# Patient Record
Sex: Male | Born: 1975 | Race: Black or African American | Hispanic: No | Marital: Single | Smoking: Current every day smoker
Health system: Southern US, Community
[De-identification: ages and names within clinical notes are randomized; demographics above are authoritative.]

## PROBLEM LIST (undated history)

## (undated) HISTORY — PX: BACK SURGERY: SHX140

---

## 2007-09-05 ENCOUNTER — Emergency Department: Payer: Self-pay | Admitting: Emergency Medicine

## 2007-11-20 ENCOUNTER — Emergency Department: Payer: Self-pay | Admitting: Emergency Medicine

## 2007-12-07 ENCOUNTER — Emergency Department: Payer: Self-pay | Admitting: Emergency Medicine

## 2008-12-11 ENCOUNTER — Emergency Department: Payer: Self-pay | Admitting: Emergency Medicine

## 2009-04-26 ENCOUNTER — Emergency Department: Payer: Self-pay | Admitting: Emergency Medicine

## 2009-06-04 ENCOUNTER — Emergency Department: Payer: Self-pay | Admitting: Emergency Medicine

## 2020-06-11 ENCOUNTER — Emergency Department: Payer: Self-pay

## 2020-06-11 ENCOUNTER — Other Ambulatory Visit: Payer: Self-pay

## 2020-06-11 ENCOUNTER — Emergency Department
Admission: EM | Admit: 2020-06-11 | Discharge: 2020-06-11 | Disposition: A | Discharge status: Home / Self Care | Payer: Self-pay | Attending: Emergency Medicine | Admitting: Emergency Medicine

## 2020-06-11 DIAGNOSIS — R0781 Pleurodynia: Secondary | ICD-10-CM

## 2020-06-11 DIAGNOSIS — F17200 Nicotine dependence, unspecified, uncomplicated: Secondary | ICD-10-CM | POA: Insufficient documentation

## 2020-06-11 LAB — CBC WITH DIFFERENTIAL/PLATELET
Abs Immature Granulocytes: 0.02 10*3/uL (ref 0.00–0.07)
Basophils Absolute: 0 10*3/uL (ref 0.0–0.1)
Basophils Relative: 0 %
Eosinophils Absolute: 0.1 10*3/uL (ref 0.0–0.5)
Eosinophils Relative: 1 %
HCT: 51.4 % (ref 39.0–52.0)
Hemoglobin: 17.4 g/dL — ABNORMAL HIGH (ref 13.0–17.0)
Immature Granulocytes: 0 %
Lymphocytes Relative: 28 %
Lymphs Abs: 2.2 10*3/uL (ref 0.7–4.0)
MCH: 33.4 pg (ref 26.0–34.0)
MCHC: 33.9 g/dL (ref 30.0–36.0)
MCV: 98.7 fL (ref 80.0–100.0)
Monocytes Absolute: 0.5 10*3/uL (ref 0.1–1.0)
Monocytes Relative: 7 %
Neutro Abs: 4.9 10*3/uL (ref 1.7–7.7)
Neutrophils Relative %: 64 %
Platelets: 295 10*3/uL (ref 150–400)
RBC: 5.21 MIL/uL (ref 4.22–5.81)
RDW: 12.3 % (ref 11.5–15.5)
WBC: 7.7 10*3/uL (ref 4.0–10.5)
nRBC: 0 % (ref 0.0–0.2)

## 2020-06-11 LAB — BASIC METABOLIC PANEL
Anion gap: 11 (ref 5–15)
BUN: 7 mg/dL (ref 6–20)
CO2: 25 mmol/L (ref 22–32)
Calcium: 10.2 mg/dL (ref 8.9–10.3)
Chloride: 105 mmol/L (ref 98–111)
Creatinine, Ser: 0.92 mg/dL (ref 0.61–1.24)
GFR, Estimated: >60 mL/min (ref >60)
Glucose, Bld: 115 mg/dL — ABNORMAL HIGH (ref 70–99)
Potassium: 6.1 mmol/L — ABNORMAL HIGH (ref 3.5–5.1)
Sodium: 141 mmol/L (ref 135–145)

## 2020-06-11 LAB — FIBRIN DERIVATIVES D-DIMER (ARMC ONLY): Fibrin derivatives D-dimer (ARMC): 127 ng/mL (FEU) (ref 0.00–499.00)

## 2020-06-11 LAB — TROPONIN I (HIGH SENSITIVITY): Troponin I (High Sensitivity): 4 ng/L (ref <18)

## 2020-06-11 MED ORDER — HYDROCODONE-ACETAMINOPHEN 5-325 MG PO TABS: 1.0000 | ORAL_TABLET | ORAL | 0 refills | Status: DC | PRN

## 2020-06-11 MED ORDER — KETOROLAC TROMETHAMINE 30 MG/ML IJ SOLN
15.0000 mg | Freq: Once | INTRAMUSCULAR | Status: AC
  Administered 2020-06-11: 15 mg via INTRAVENOUS
  Filled 2020-06-11: qty 1

## 2020-06-11 MED ORDER — PREDNISONE 10 MG (21) PO TBPK: ORAL_TABLET | ORAL | 0 refills | Status: DC

## 2020-06-11 NOTE — ED Triage Notes (Signed)
[place] c/o left sided/lateral chest pain with cough and deep breathing since yesterday, states he has been having a cough in the past few days,.

## 2020-06-11 NOTE — ED Notes (Signed)
Pt alert and oriented X 4, stable for discharge. RR even and unlabored, color WNL. Discussed discharge instructions and follow-up as directed. Discharge medications discussed if provided. Pt had opportunity to ask questions if necessary and RN to provide patient/family eduction.  

## 2020-06-11 NOTE — ED Provider Notes (Signed)
[facility] Emergency Department Provider Note ____________________________________________   First MD Initiated Contact with Patient 06/11/20 1132     (approximate)  I have reviewed the triage vital signs and the nursing notes.   HISTORY  Chief Complaint Pleurisy  HPI Lucas Santos is a 44 y.o. male with history of back surgery presents to the emergency department for treatment and evaluation of left sided chest wall pain that started yesterday. Pain is worse with deep breath, cough, or movement. No specific injury. He has had a cough for the past few days, but otherwise at his normal state of health.          History reviewed. No pertinent past medical history.  There are no problems to display for this patient.   Past Surgical History:  Procedure Laterality Date  . BACK SURGERY      Prior to Admission medications   Medication Sig Start Date End Date Taking? Authorizing Provider  HYDROcodone-acetaminophen (NORCO/VICODIN) 5-325 MG tablet Take 1 tablet by mouth every 4 (four) hours as needed for moderate pain. 06/11/20 06/11/21  Fred Franzen, Kasandra Knudsen, FNP  predniSONE (STERAPRED UNI-PAK 21 TAB) 10 MG (21) TBPK tablet Take 6 tablets on the first day and decrease by 1 tablet each day until finished. 06/11/20   Chinita Pester, FNP    Allergies Patient has no known allergies.  No family history on file.  Social History Social History   Tobacco Use  . Smoking status: Current Every Day Smoker  . Smokeless tobacco: Never Used  Substance Use Topics  . Alcohol use: Yes  . Drug use: Not Currently    Review of Systems  Constitutional: No fever/chills Eyes: No visual changes. ENT: No sore throat. Cardiovascular: Denies chest pain. Respiratory: Denies shortness of breath. Gastrointestinal: No abdominal pain.  No nausea, no vomiting.  No diarrhea.  No constipation. Genitourinary: Negative for dysuria. Musculoskeletal: Positive for back  pain. Skin: Negative for rash. Neurological: Negative for headaches, focal weakness or numbness  ____________________________________________   PHYSICAL EXAM:  VITAL SIGNS: ED Triage Vitals  Enc Vitals Group     BP 06/11/20 0942 114/80     Pulse Rate 06/11/20 0942 90     Resp 06/11/20 1128 20     Temp 06/11/20 0942 98.6 F (37 C)     Temp Source 06/11/20 0942 Oral     SpO2 06/11/20 0942 99 %     Weight 06/11/20 0938 170 lb (77.1 kg)     Height 06/11/20 0938 5\' 11"  (1.803 m)     Head Circumference --      Peak Flow --      Pain Score 06/11/20 0938 9     Pain Loc --      Pain Edu? --      Excl. in GC? --     Constitutional: Alert and oriented. Well appearing and in no acute distress. Eyes: Conjunctivae are normal. PERRL. EOMI. Head: Atraumatic. Nose: No congestion/rhinnorhea. Mouth/Throat: Mucous membranes are moist.  Oropharynx non-erythematous. Neck: No stridor.   Hematological/Lymphatic/Immunilogical: No cervical lymphadenopathy. Cardiovascular: Normal rate, regular rhythm. Grossly normal heart sounds.  Good peripheral circulation. Respiratory: Normal respiratory effort.  No retractions. Lungs CTAB. Gastrointestinal: Soft and nontender. No distention. No abdominal bruits. No CVA tenderness. Genitourinary:  Musculoskeletal: No lower extremity tenderness nor edema.  No joint effusions. Neurologic:  Normal speech and language. No gross focal neurologic deficits are appreciated. No gait instability. Skin:  Skin is warm, dry and intact. No  rash noted. Psychiatric: Mood and affect are normal. Speech and behavior are normal.  ____________________________________________   LABS (all labs ordered are listed, but only abnormal results are displayed)  Labs Reviewed  BASIC METABOLIC PANEL - Abnormal; Notable for the following components:      Result Value   Potassium 6.1 (*)    Glucose, Bld 115 (*)    All other components within normal limits  CBC WITH  DIFFERENTIAL/PLATELET - Abnormal; Notable for the following components:   Hemoglobin 17.4 (*)    All other components within normal limits  FIBRIN DERIVATIVES D-DIMER ([facility] ONLY)  TROPONIN I (HIGH SENSITIVITY)   ____________________________________________  EKG  Normal Sinus Rhythm, rate 88, no ectopy ____________________________________________  RADIOLOGY  ED MD interpretation:    No acute cardiopulmonary abnormality.  I, Kem Boroughs, personally viewed and evaluated these images (plain radiographs) as part of my medical decision making, as well as reviewing the written report by the radiologist.  Official radiology report(s): No results found.  ____________________________________________   PROCEDURES  Procedure(s) performed (including Critical Care):  Procedures  ____________________________________________   INITIAL IMPRESSION / ASSESSMENT AND PLAN     44 year old male presenting to the emergency department for treatment and evaluation of pain lateral chest wall that increases with deep breath or movement.  See HPI for further details.  Labs and EKG obtained while awaiting ER room assignment reviewed.  Plan will be to check D-dimer and medicate for pain.  DIFFERENTIAL DIAGNOSIS  Musculoskeletal pain, PE, pleurisy  ED COURSE  D-dimer is within normal limits, EKG shows sinus rhythm, chest x-ray shows no cardiopulmonary abnormality and pain has lessened with medication.  Plan will be to have him discharged home to follow-up with primary care.  He will be given prescription for prednisone and Norco.  He is to return to the emergency department for symptoms of change or worsen if he is unable to schedule appointment with primary care.    ___________________________________________   FINAL CLINICAL IMPRESSION(S) / ED DIAGNOSES  Final diagnoses:  Pleuritic chest pain     ED Discharge Orders         Ordered    predniSONE (STERAPRED UNI-PAK 21 TAB) 10 MG (21)  TBPK tablet  Status:  Discontinued        06/11/20 1332    HYDROcodone-acetaminophen (NORCO/VICODIN) 5-325 MG tablet  Every 4 hours PRN,   Status:  Discontinued        06/11/20 1332    HYDROcodone-acetaminophen (NORCO/VICODIN) 5-325 MG tablet  Every 4 hours PRN,   Status:  Discontinued        06/11/20 1333    predniSONE (STERAPRED UNI-PAK 21 TAB) 10 MG (21) TBPK tablet  Status:  Discontinued        06/11/20 1333    predniSONE (STERAPRED UNI-PAK 21 TAB) 10 MG (21) TBPK tablet        06/11/20 1334    HYDROcodone-acetaminophen (NORCO/VICODIN) 5-325 MG tablet  Every 4 hours PRN        06/11/20 1334           Hesston L Arvidson was evaluated in Emergency Department on 06/15/2020 for the symptoms described in the history of present illness. He was evaluated in the context of the global COVID-19 pandemic, which necessitated consideration that the patient might be at risk for infection with the SARS-CoV-2 virus that causes COVID-19. Institutional protocols and algorithms that pertain to the evaluation of patients at risk for COVID-19 are in a state of rapid change based  on information released by regulatory bodies including the CDC and federal and state organizations. These policies and algorithms were followed during the patient's care in the ED.   Note:  This document was prepared using Dragon voice recognition software and may include unintentional dictation errors.   Chinita Pester, FNP 06/15/20 1617    Shaune Pollack, MD 06/18/20 1501

## 2020-06-11 NOTE — Discharge Instructions (Signed)
Follow up with the primary care provider of your choice for symptoms that are not improving over the next few days.  Return to the ER for symptoms that change or worsen if unable to schedule an appointment. 

## 2020-10-24 ENCOUNTER — Other Ambulatory Visit: Payer: Self-pay

## 2020-10-24 ENCOUNTER — Encounter: Payer: Self-pay | Admitting: Emergency Medicine

## 2020-10-24 ENCOUNTER — Emergency Department: Payer: Self-pay

## 2020-10-24 ENCOUNTER — Emergency Department
Admission: EM | Admit: 2020-10-24 | Discharge: 2020-10-25 | Disposition: A | Discharge status: Home / Self Care | Payer: Self-pay | Attending: Emergency Medicine | Admitting: Emergency Medicine

## 2020-10-24 DIAGNOSIS — F17200 Nicotine dependence, unspecified, uncomplicated: Secondary | ICD-10-CM | POA: Insufficient documentation

## 2020-10-24 DIAGNOSIS — Y9369 Activity, other involving other sports and athletics played as a team or group: Secondary | ICD-10-CM | POA: Insufficient documentation

## 2020-10-24 DIAGNOSIS — S81801A Unspecified open wound, right lower leg, initial encounter: Secondary | ICD-10-CM | POA: Insufficient documentation

## 2020-10-24 DIAGNOSIS — W34010A Accidental discharge of airgun, initial encounter: Secondary | ICD-10-CM | POA: Insufficient documentation

## 2020-10-24 MED ORDER — CEPHALEXIN 500 MG PO CAPS
500.0000 mg | ORAL_CAPSULE | Freq: Once | ORAL | Status: AC
  Administered 2020-10-25: 500 mg via ORAL
  Filled 2020-10-24: qty 1

## 2020-10-24 NOTE — ED Notes (Signed)
Report to [facility]
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
## 2020-10-25 MED ORDER — CEPHALEXIN 500 MG PO CAPS: 500.0000 mg | ORAL_CAPSULE | Freq: Four times a day (QID) | ORAL | 0 refills | Status: AC
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
## 2020-12-27 IMAGING — CR DG CHEST 2V
1 series · 2 of 2 positions shown · non-contrast
Comparison: None.

CLINICAL DATA: Chest pain and cough for 2 days, initial encounter

EXAM:
CHEST - 2 VIEW

[Series 1: dg chest 2 view · 0.14mm/px · 2 of 2 slices shown]
[im 1/2]
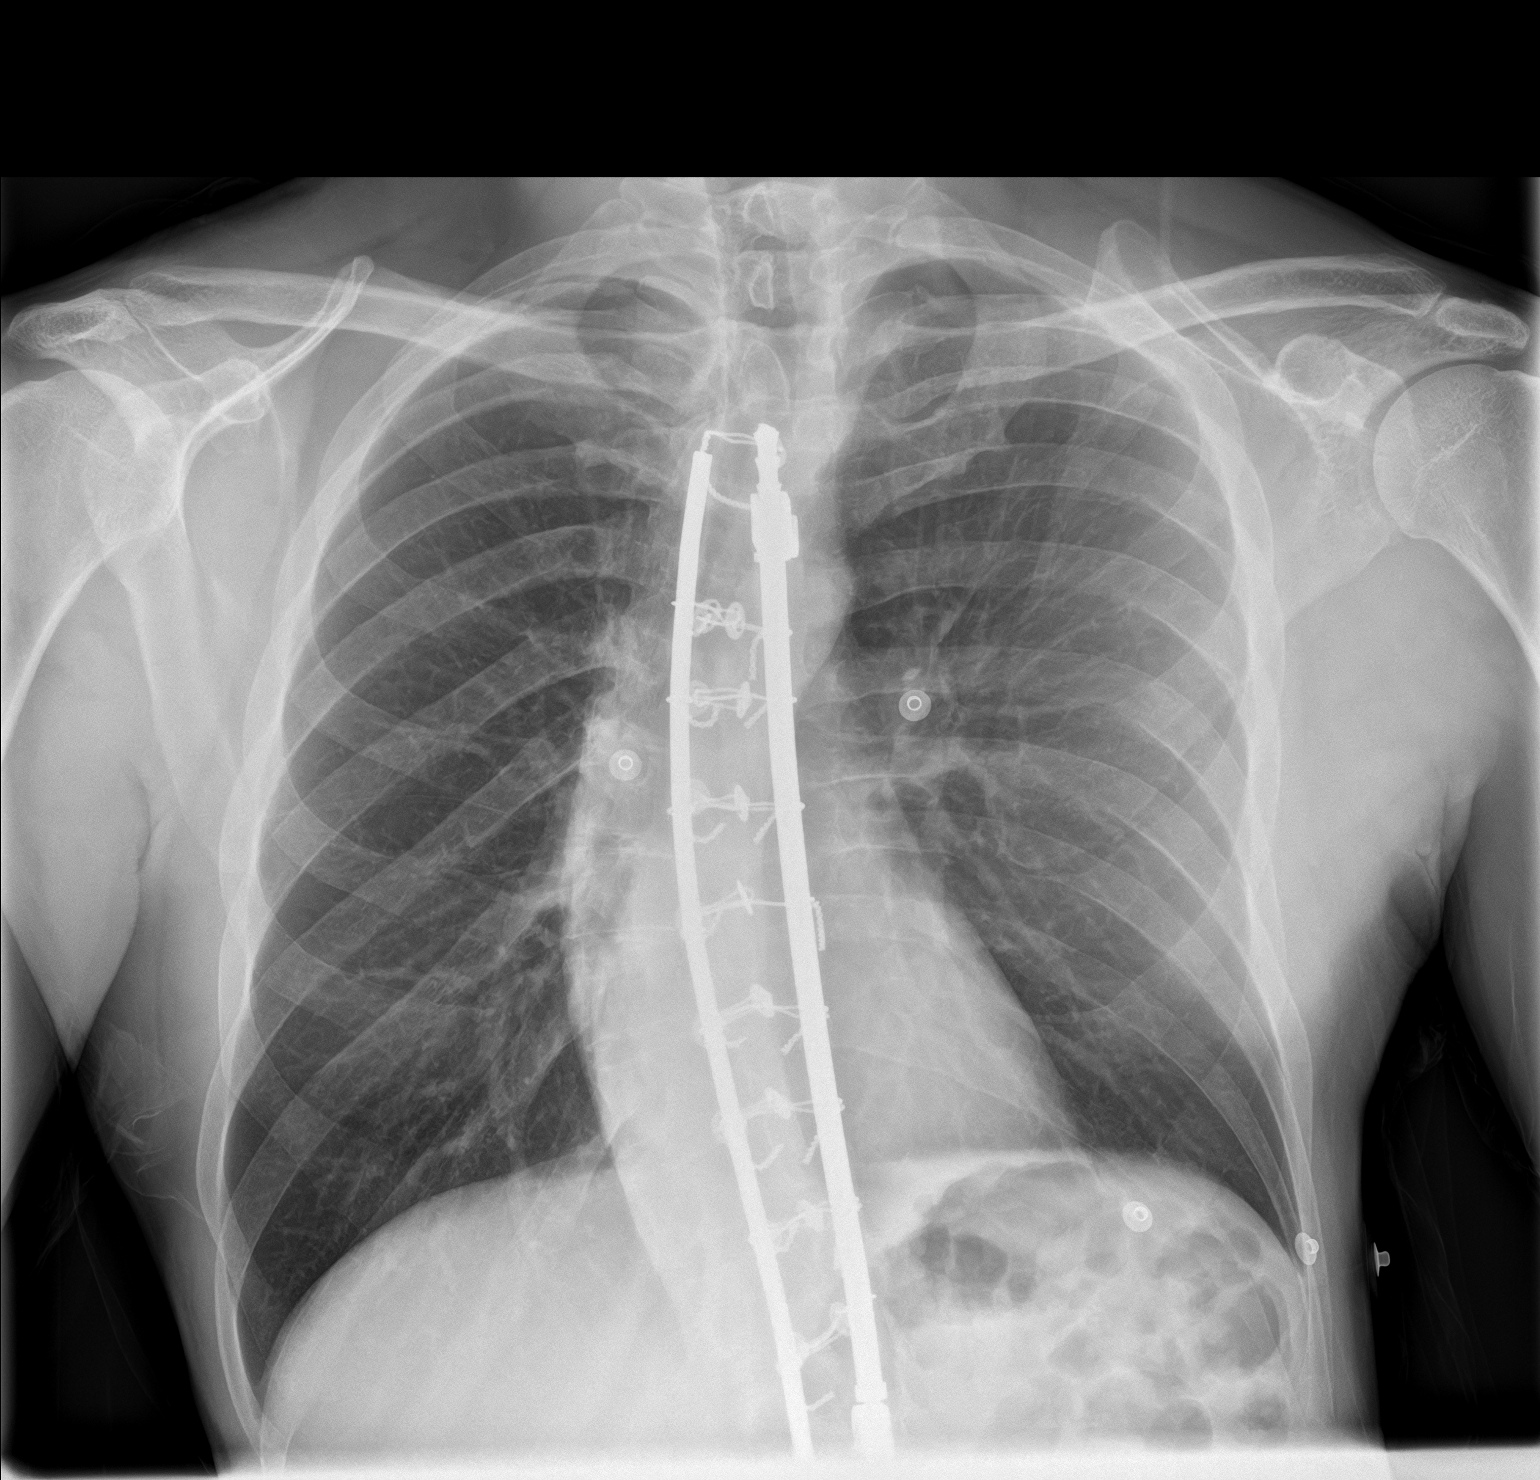
[im 2/2]
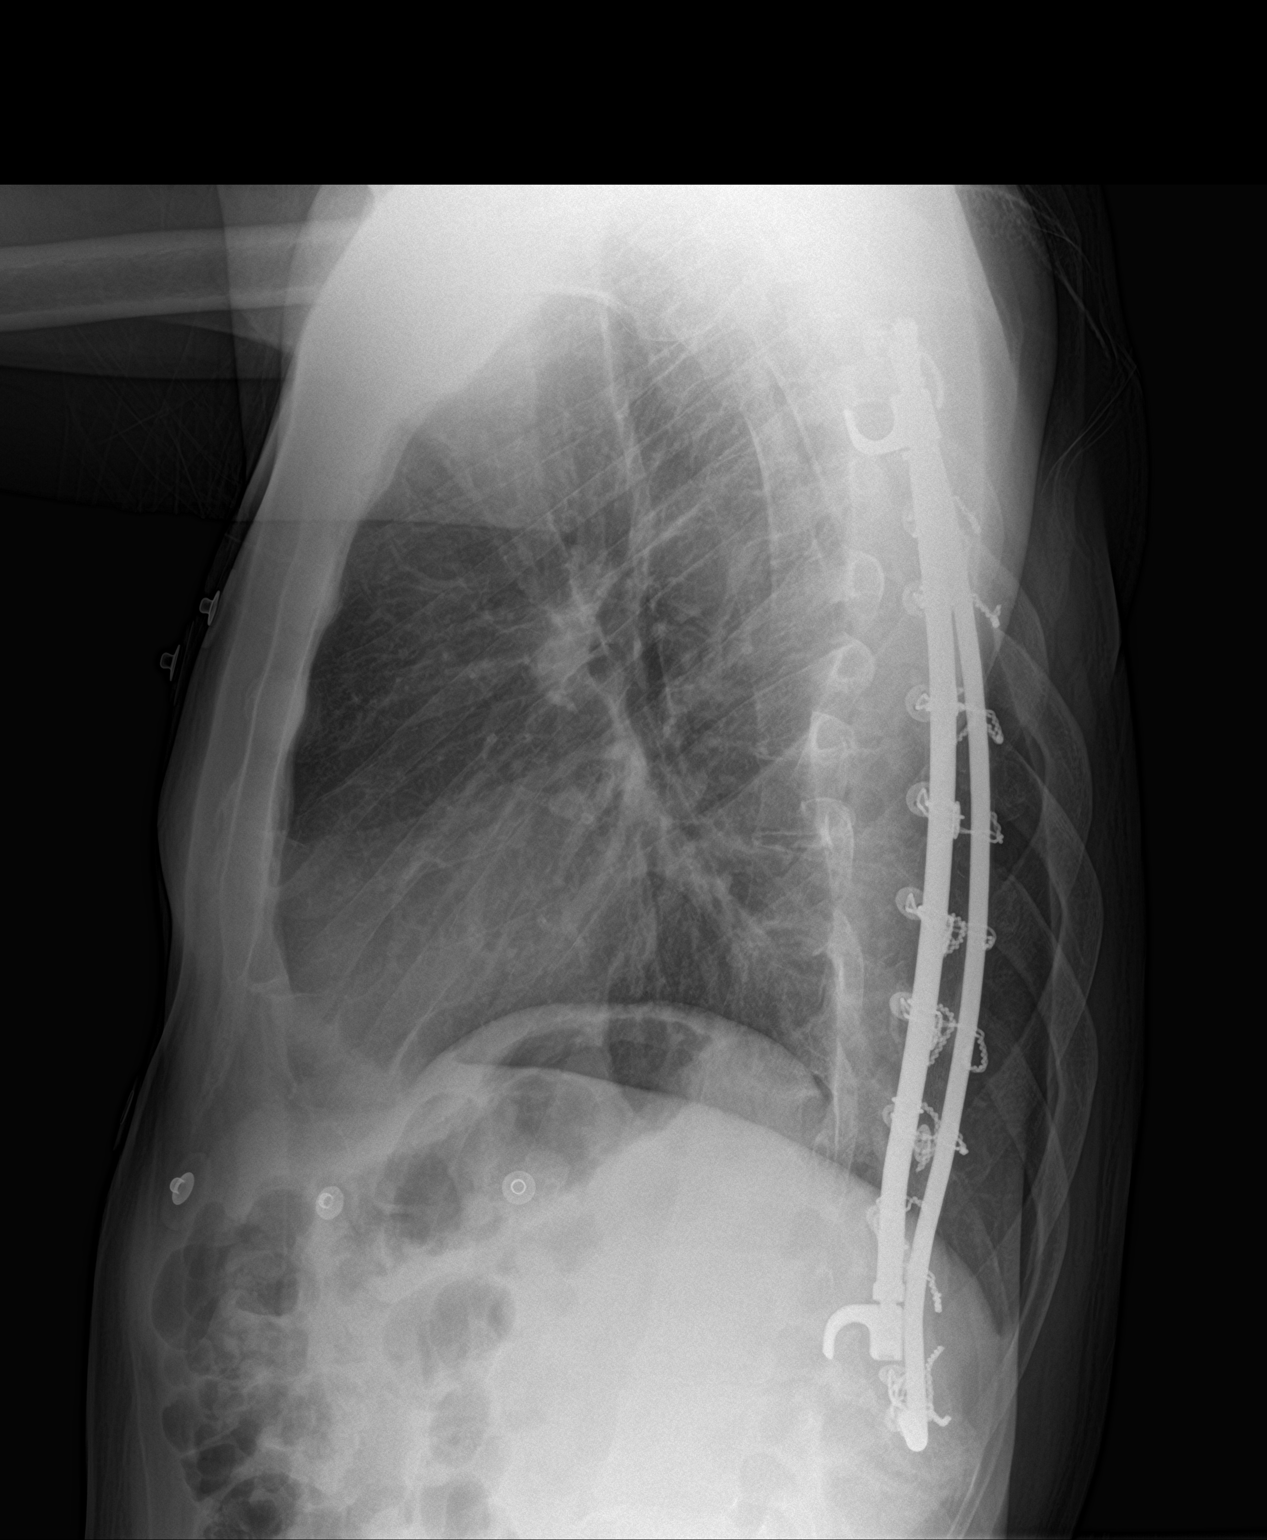

[2 of 2 positions shown; findings below may reference images not displayed]

FINDINGS: Cardiac shadow is within normal limits. The lungs are clear
bilaterally. Scoliosis of the thoracic spine concave to the left is
noted. Fixation rods are seen.
IMPRESSION: No acute abnormality noted.

## 2021-04-12 ENCOUNTER — Emergency Department (HOSPITAL_COMMUNITY): Payer: Self-pay

## 2021-04-12 ENCOUNTER — Other Ambulatory Visit: Payer: Self-pay

## 2021-04-12 ENCOUNTER — Emergency Department (HOSPITAL_COMMUNITY)
Admission: EM | Admit: 2021-04-12 | Discharge: 2021-04-12 | Disposition: A | Discharge status: Home / Self Care | Payer: Self-pay | Attending: Emergency Medicine | Admitting: Emergency Medicine

## 2021-04-12 DIAGNOSIS — F17200 Nicotine dependence, unspecified, uncomplicated: Secondary | ICD-10-CM | POA: Insufficient documentation

## 2021-04-12 DIAGNOSIS — R0789 Other chest pain: Secondary | ICD-10-CM | POA: Insufficient documentation

## 2021-04-12 DIAGNOSIS — Z20822 Contact with and (suspected) exposure to covid-19: Secondary | ICD-10-CM | POA: Insufficient documentation

## 2021-04-12 LAB — CBC
HCT: 45.2 % (ref 39.0–52.0)
Hemoglobin: 15.4 g/dL (ref 13.0–17.0)
MCH: 33.6 pg (ref 26.0–34.0)
MCHC: 34.1 g/dL (ref 30.0–36.0)
MCV: 98.5 fL (ref 80.0–100.0)
Platelets: 301 10*3/uL (ref 150–400)
RBC: 4.59 MIL/uL (ref 4.22–5.81)
RDW: 11 % — ABNORMAL LOW (ref 11.5–15.5)
WBC: 6.7 10*3/uL (ref 4.0–10.5)
nRBC: 0 % (ref 0.0–0.2)

## 2021-04-12 LAB — BASIC METABOLIC PANEL
Anion gap: 6 (ref 5–15)
BUN: 5 mg/dL — ABNORMAL LOW (ref 6–20)
CO2: 28 mmol/L (ref 22–32)
Calcium: 9.6 mg/dL (ref 8.9–10.3)
Chloride: 103 mmol/L (ref 98–111)
Creatinine, Ser: 0.81 mg/dL (ref 0.61–1.24)
GFR, Estimated: >60 mL/min (ref >60)
Glucose, Bld: 107 mg/dL — ABNORMAL HIGH (ref 70–99)
Potassium: 4.8 mmol/L (ref 3.5–5.1)
Sodium: 137 mmol/L (ref 135–145)

## 2021-04-12 LAB — TROPONIN I (HIGH SENSITIVITY)
Troponin I (High Sensitivity): 4 ng/L (ref <18)
Troponin I (High Sensitivity): 4 ng/L (ref <18)

## 2021-04-12 LAB — RESP PANEL BY RT-PCR (FLU A&B, COVID) ARPGX2
Influenza A by PCR: NEGATIVE
Influenza B by PCR: NEGATIVE
SARS Coronavirus 2 by RT PCR: NEGATIVE

## 2021-04-12 MED ORDER — LIDOCAINE 5 % EX PTCH
1.0000 | MEDICATED_PATCH | CUTANEOUS | Status: DC
  Administered 2021-04-12: 1 via TRANSDERMAL
  Filled 2021-04-12 (×2): qty 1
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
## 2021-04-12 NOTE — ED Provider Notes (Signed)
[facility] EMERGENCY DEPARTMENT Provider Note   CSN: [id number] Arrival date & time: 04/12/21  1133     History Chief Complaint  Patient presents with   Chest Pain    Lucas Santos is a 45 y.o. male who is incarcerated who presents to the emergency department by EMS with concern for 2 episodes of sharp right-sided chest pain.  The first of which happened last night, then again this morning shortly after he woke up.  Each time the pain lasted for less than 5 minutes and then resolved on its own.  Nothing made it worse or better.  [facility] who gave him 3 tablets of nitroglycerin and aspirin, though he states that each of these times he was not experiencing any chest pain.  He also placed him on oxygen.  He does not have any history of cardiac issues.  He has been chest pain-free at this time.  Pain did not radiate, was not exertional in nature, and had no associated shortness of breath or nausea.  Denies any palpitations.   I personally reviewed this patient's medical records.  He was seen last November at [facility] and diagnosed with pleuritic chest pain after similar episode to today's.  Patient has not had any recent viral illnesses and also denies any new activities or exercises.  He denies any recent prolonged travel or immobilization, denies any history of bleeding or clotting disorder, and denies any recent surgical intervention.  Patient does have history of back surgery in the past.  HPI  HPI: A 45 year old patient presents for evaluation of chest pain. Initial onset of pain was approximately 3-6 hours ago. The patient's chest pain is well-localized, is sharp and is not worse with exertion. The patient's chest pain is not middle- or left-sided, is not described as heaviness/pressure/tightness and does not radiate to the arms/jaw/neck. The patient does not complain of nausea and denies diaphoresis. The patient has a family history of  coronary artery disease in a first-degree relative with onset less than age 32. The patient has no history of stroke, has no history of peripheral artery disease, has not smoked in the past 90 days, denies any history of treated diabetes, is not hypertensive, has no history of hypercholesterolemia and does not have an elevated BMI (>=30).   No past medical history on file.  There are no problems to display for this patient.   Past Surgical History:  Procedure Laterality Date   BACK SURGERY         No family history on file.  Social History   Tobacco Use   Smoking status: Every Day   Smokeless tobacco: Never  Substance Use Topics   Alcohol use: Yes   Drug use: Not Currently    Home Medications Prior to Admission medications   Not on File    Allergies    Patient has no known allergies.  Review of Systems   Review of Systems  Constitutional: Negative.   HENT: Negative.    Eyes: Negative.   Respiratory: Negative.    Cardiovascular:  Positive for chest pain. Negative for palpitations and leg swelling.  Gastrointestinal: Negative.   Genitourinary: Negative.   Musculoskeletal: Negative.   Skin: Negative.   Neurological: Negative.   Hematological: Negative.    Physical Exam Updated Vital Signs BP 127/86   Pulse 79   Temp 98.5 F (36.9 C) (Oral)   Resp 18   SpO2 98%   Physical  Exam Vitals and nursing note reviewed.  Constitutional:      Appearance: He is normal weight. He is not ill-appearing or toxic-appearing.  HENT:     Head: Normocephalic and atraumatic.     Nose: Nose normal.     Mouth/Throat:     Mouth: Mucous membranes are moist.     Pharynx: Oropharynx is clear. Uvula midline. No oropharyngeal exudate or posterior oropharyngeal erythema.     Tonsils: No tonsillar exudate.  Eyes:     General: Lids are normal. Vision grossly intact.        Right eye: No discharge.        Left eye: No discharge.     Extraocular Movements: Extraocular movements  intact.     Conjunctiva/sclera: Conjunctivae normal.     Pupils: Pupils are equal, round, and reactive to light.  Neck:     Trachea: Trachea and phonation normal.  Cardiovascular:     Rate and Rhythm: Normal rate and regular rhythm.     Pulses: Normal pulses.     Heart sounds: Normal heart sounds. No murmur heard. Pulmonary:     Effort: Pulmonary effort is normal. No tachypnea, bradypnea, accessory muscle usage, prolonged expiration or respiratory distress.     Breath sounds: Normal breath sounds. No wheezing or rales.  Chest:     Chest wall: No mass, lacerations, deformity, swelling, tenderness, crepitus or edema.    Abdominal:     General: Bowel sounds are normal. There is no distension.     Palpations: Abdomen is soft.     Tenderness: There is no abdominal tenderness. There is no right CVA tenderness, left CVA tenderness, guarding or rebound.  Musculoskeletal:        General: No deformity.     Cervical back: Normal range of motion and neck supple. No edema, rigidity or crepitus. No pain with movement, spinous process tenderness or muscular tenderness.     Right lower leg: No edema.     Left lower leg: No edema.       Legs:  Lymphadenopathy:     Cervical: No cervical adenopathy.  Skin:    General: Skin is warm and dry.     Capillary Refill: Capillary refill takes less than 2 seconds.  Neurological:     General: No focal deficit present.     Mental Status: He is alert. Mental status is at baseline.  Psychiatric:        Mood and Affect: Mood normal.    ED Results / Procedures / Treatments   Labs (all labs ordered are listed, but only abnormal results are displayed) Labs Reviewed  BASIC METABOLIC PANEL - Abnormal; Notable for the following components:      Result Value   Glucose, Bld 107 (*)    BUN 5 (*)    All other components within normal limits  CBC - Abnormal; Notable for the following components:   RDW 11.0 (*)    All other components within normal limits  RESP  PANEL BY RT-PCR (FLU A&B, COVID) [id number]  TROPONIN I (HIGH SENSITIVITY)  TROPONIN I (HIGH SENSITIVITY)    EKG EKG Interpretation  Date/Time:  Friday April 12 2021 11:39:10 EDT Ventricular Rate:  71 PR Interval:  132 QRS Duration: 92 QT Interval:  384 QTC Calculation: 417 R Axis:   86 Text Interpretation: Normal sinus rhythm Left ventricular hypertrophy Nonspecific ST abnormality Confirmed by Cathren Laine ([id number]) on 04/12/2021 12:18:48 PM  Radiology DG Chest Port 1 View  Result Date:  04/12/2021 CLINICAL DATA:  Right-sided chest pain EXAM: PORTABLE CHEST 1 VIEW COMPARISON:  06/11/2020 FINDINGS: The heart size and mediastinal contours are within normal limits. Both lungs are clear. Partially imaged thoracolumbar rod fusion. IMPRESSION: No acute abnormality of the lungs in AP portable projection. Electronically Signed   By: Lauralyn Primes M.D.   On: 04/12/2021 13:23    Procedures Procedures   Medications Ordered in ED Medications - No data to display  ED Course  I have reviewed the triage vital signs and the nursing notes.  Pertinent labs & imaging results that were available during my care of the patient were reviewed by me and considered in my medical decision making (see chart for details).    MDM Rules/Calculators/A&P HEAR Score: 2                       45 year old male who presents with concern for 2 episodes of right lower rib pain.  Different diagnose includes limited to pleurisy, musculoskeletal injury, PE, dysrhythmia, ACS, pleural effusion, pneumonia.  Hypertensive on intake, vitals otherwise normal.  Cardiopulmonary exam is normal, abdominal exam is benign.  Musculoskeletal exam unremarkable without tenderness to palpation.  Neurovascularly intact in all 4 extremities.  CBC unremarkable, BMP unremarkable, troponin is negative, 4, respiratory pathogen panel is negative.  EKG reassuring, normal sinus rhythm with nonspecific ST abnormality without STEMI.  Chest x-ray  negative for acute cardiopulmonary disease.  Patient is PERC negative. HEART score of 2.   Overall work-up was very reassuring.  Does not appear to be any emergent cardiopulmonary issue.  Suspect musculoskeletal injury/spasm.  May continue to use Tylenol or ibuprofen as needed.  Recommend heat /ice application.  No further work-up warranted in the ED at this time.  Francee Piccolo voiced understanding of his medical evaluation and treatment plan.  Each of his questions answered to his expressed satisfaction.  Return precautions were given.  Patient is well-appearing, stable, and appropriate for discharge at this time.  This chart was dictated using voice recognition software, Dragon. Despite the best efforts of this provider to proofread and correct errors, errors may still occur which can change documentation meaning.  Final Clinical Impression(s) / ED Diagnoses Final diagnoses:  Atypical chest pain    Rx / DC Orders ED Discharge Orders     None        Sherrilee Gilles 04/12/21 1516    Cathren Laine, MD 04/12/21 [phone]

## 2021-10-28 IMAGING — DX DG CHEST 1V PORT
1 series · 1 of 1 positions shown · non-contrast
Comparison: 06/11/2020

CLINICAL DATA: Right-sided chest pain

EXAM:
PORTABLE CHEST 1 VIEW

[chest]
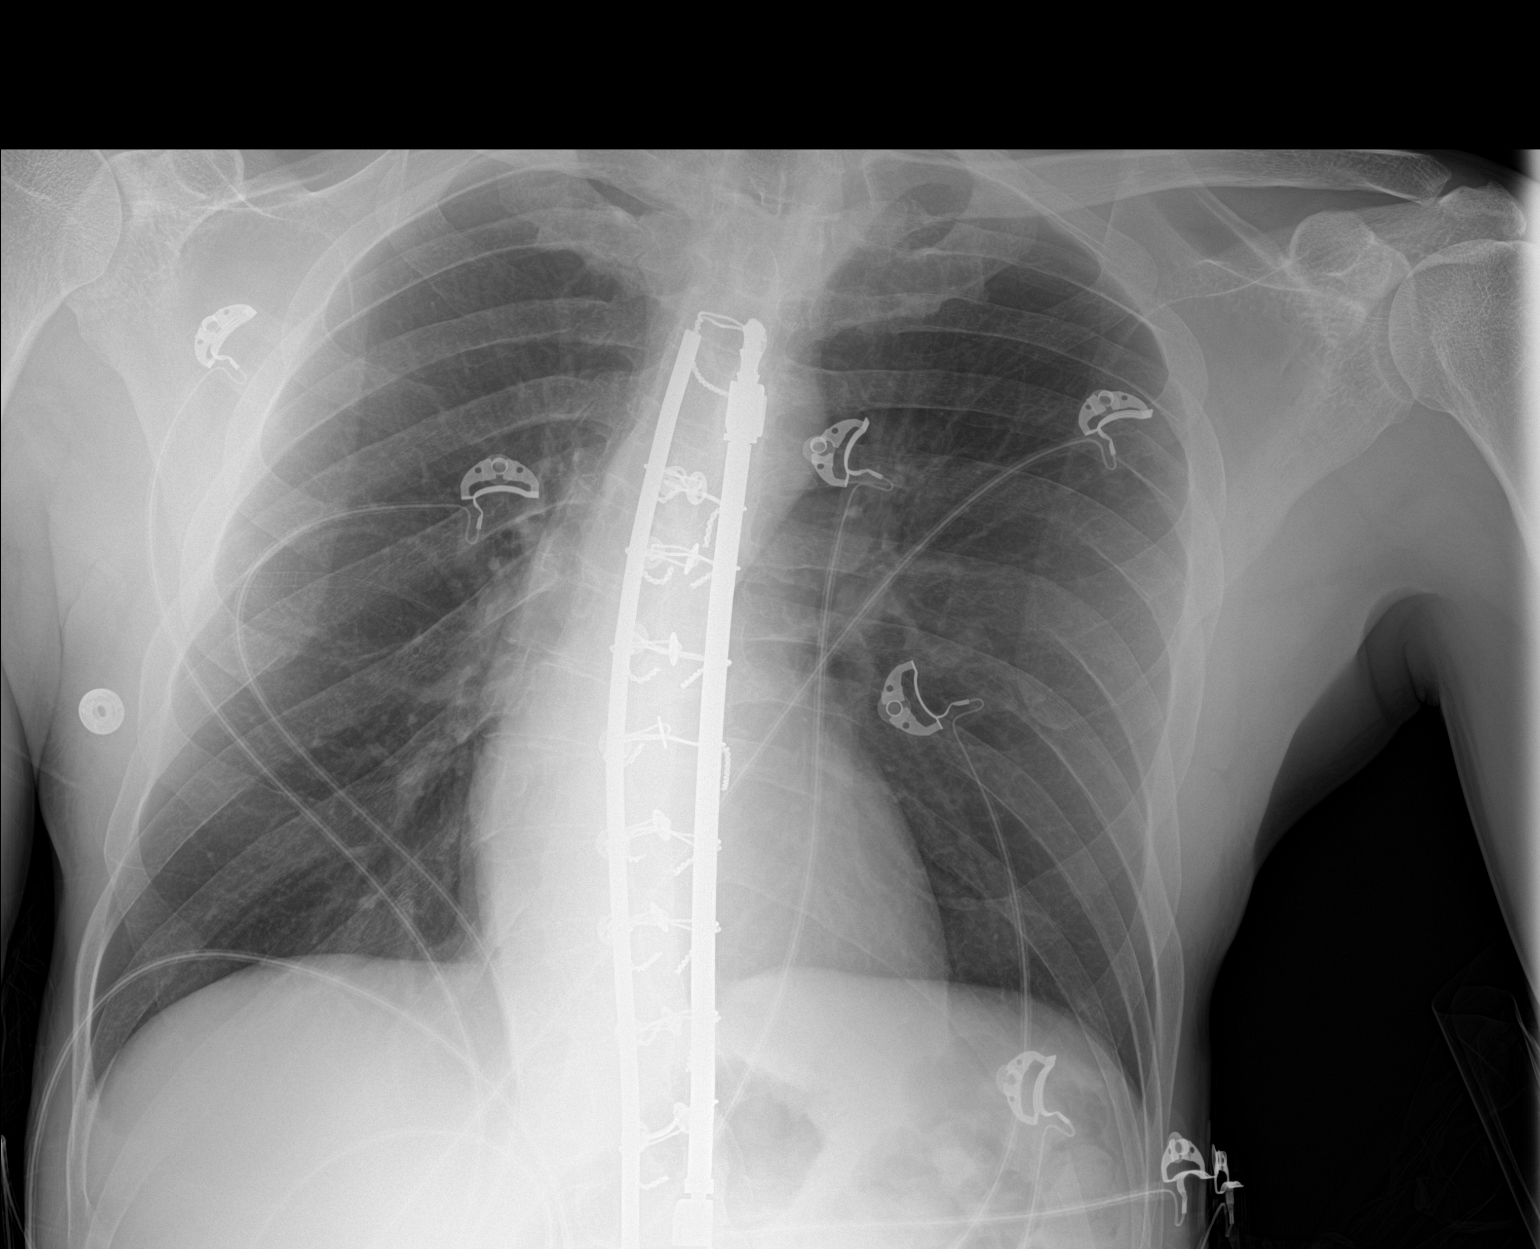

[1 of 1 positions shown; findings below may reference images not displayed]

FINDINGS: The heart size and mediastinal contours are within normal limits.
Both lungs are clear. Partially imaged thoracolumbar rod fusion.
IMPRESSION: No acute abnormality of the lungs in AP portable projection.
